# Patient Record
Sex: Male | Born: 1980 | Race: Black or African American | Hispanic: No | Marital: Single | Smoking: Never smoker
Health system: Southern US, Community
[De-identification: ages and names within clinical notes are randomized; demographics above are authoritative.]

---

## 2008-01-29 ENCOUNTER — Observation Stay: Payer: Self-pay | Admitting: Internal Medicine

## 2009-04-04 ENCOUNTER — Emergency Department: Payer: Self-pay | Admitting: Emergency Medicine

## 2009-10-11 ENCOUNTER — Emergency Department: Payer: Self-pay | Admitting: Emergency Medicine

## 2012-03-21 ENCOUNTER — Emergency Department: Payer: Self-pay | Admitting: Internal Medicine

## 2012-03-21 LAB — BASIC METABOLIC PANEL
Calcium, Total: 9 mg/dL (ref 8.5–10.1)
Co2: 25 mmol/L (ref 21–32)
Creatinine: 1.15 mg/dL (ref 0.60–1.30)
Glucose: 100 mg/dL — ABNORMAL HIGH (ref 65–99)
Osmolality: 278 (ref 275–301)

## 2012-03-21 LAB — CBC WITH DIFFERENTIAL/PLATELET
Basophil #: 0 10*3/uL (ref 0.0–0.1)
Basophil %: 0.4 %
HCT: 44.7 % (ref 40.0–52.0)
HGB: 14.6 g/dL (ref 13.0–18.0)
Lymphocyte %: 10.3 %
MCHC: 32.6 g/dL (ref 32.0–36.0)
MCV: 81 fL (ref 80–100)
Monocyte #: 0.4 x10 3/mm (ref 0.2–1.0)
Neutrophil #: 9.8 10*3/uL — ABNORMAL HIGH (ref 1.4–6.5)
Platelet: 289 10*3/uL (ref 150–440)
RBC: 5.49 10*6/uL (ref 4.40–5.90)
RDW: 14.9 % — ABNORMAL HIGH (ref 11.5–14.5)
WBC: 11.4 10*3/uL — ABNORMAL HIGH (ref 3.8–10.6)

## 2012-03-21 LAB — RAPID INFLUENZA A&B ANTIGENS

## 2012-05-02 ENCOUNTER — Emergency Department: Payer: Self-pay | Admitting: Emergency Medicine

## 2012-08-30 ENCOUNTER — Emergency Department: Payer: Self-pay | Admitting: Emergency Medicine

## 2013-06-05 ENCOUNTER — Emergency Department: Payer: Self-pay | Admitting: Emergency Medicine

## 2013-09-25 ENCOUNTER — Emergency Department: Payer: Self-pay | Admitting: Emergency Medicine

## 2013-09-25 LAB — CBC WITH DIFFERENTIAL/PLATELET
BASOS PCT: 1.3 %
Basophil #: 0.1 10*3/uL (ref 0.0–0.1)
EOS ABS: 0.1 10*3/uL (ref 0.0–0.7)
EOS PCT: 1.7 %
HCT: 42.5 % (ref 40.0–52.0)
HGB: 13.8 g/dL (ref 13.0–18.0)
LYMPHS ABS: 1.9 10*3/uL (ref 1.0–3.6)
Lymphocyte %: 30.9 %
MCH: 27 pg (ref 26.0–34.0)
MCHC: 32.5 g/dL (ref 32.0–36.0)
MCV: 83 fL (ref 80–100)
Monocyte #: 0.5 x10 3/mm (ref 0.2–1.0)
Monocyte %: 8.9 %
Neutrophil #: 3.5 10*3/uL (ref 1.4–6.5)
Neutrophil %: 57.2 %
Platelet: 326 10*3/uL (ref 150–440)
RBC: 5.12 10*6/uL (ref 4.40–5.90)
RDW: 15 % — ABNORMAL HIGH (ref 11.5–14.5)
WBC: 6.1 10*3/uL (ref 3.8–10.6)

## 2013-09-25 LAB — URINALYSIS, COMPLETE
BLOOD: NEGATIVE
Bacteria: NONE SEEN
Bilirubin,UR: NEGATIVE
Glucose,UR: NEGATIVE mg/dL (ref 0–75)
Ketone: NEGATIVE
Leukocyte Esterase: NEGATIVE
Nitrite: NEGATIVE
PH: 6 (ref 4.5–8.0)
PROTEIN: NEGATIVE
RBC,UR: 3 /HPF (ref 0–5)
SPECIFIC GRAVITY: 1.02 (ref 1.003–1.030)
Squamous Epithelial: <1

## 2013-09-25 LAB — COMPREHENSIVE METABOLIC PANEL
ALBUMIN: 3.6 g/dL (ref 3.4–5.0)
ALK PHOS: 66 U/L
ALT: 23 U/L
ANION GAP: 9 (ref 7–16)
AST: 27 U/L (ref 15–37)
BUN: 15 mg/dL (ref 7–18)
Bilirubin,Total: 0.2 mg/dL (ref 0.2–1.0)
CO2: 24 mmol/L (ref 21–32)
Calcium, Total: 8.7 mg/dL (ref 8.5–10.1)
Chloride: 107 mmol/L (ref 98–107)
Creatinine: 1 mg/dL (ref 0.60–1.30)
EGFR (Non-African Amer.): >60
Glucose: 83 mg/dL (ref 65–99)
Osmolality: 279 (ref 275–301)
Potassium: 4.2 mmol/L (ref 3.5–5.1)
SODIUM: 140 mmol/L (ref 136–145)
Total Protein: 7.9 g/dL (ref 6.4–8.2)

## 2013-09-25 LAB — LIPASE, BLOOD: Lipase: 145 U/L (ref 73–393)

## 2013-10-19 ENCOUNTER — Ambulatory Visit: Payer: Self-pay | Admitting: Unknown Physician Specialty

## 2014-02-01 ENCOUNTER — Emergency Department: Payer: Self-pay | Admitting: Emergency Medicine

## 2014-08-08 ENCOUNTER — Emergency Department
Admission: EM | Admit: 2014-08-08 | Discharge: 2014-08-08 | Disposition: A | Discharge status: Home / Self Care | Payer: Self-pay | Attending: Emergency Medicine | Admitting: Emergency Medicine

## 2014-08-08 ENCOUNTER — Emergency Department: Payer: Self-pay

## 2014-08-08 DIAGNOSIS — Y9289 Other specified places as the place of occurrence of the external cause: Secondary | ICD-10-CM | POA: Insufficient documentation

## 2014-08-08 DIAGNOSIS — S60222A Contusion of left hand, initial encounter: Secondary | ICD-10-CM | POA: Insufficient documentation

## 2014-08-08 DIAGNOSIS — W228XXA Striking against or struck by other objects, initial encounter: Secondary | ICD-10-CM | POA: Insufficient documentation

## 2014-08-08 DIAGNOSIS — Y9389 Activity, other specified: Secondary | ICD-10-CM | POA: Insufficient documentation

## 2014-08-08 DIAGNOSIS — Y998 Other external cause status: Secondary | ICD-10-CM | POA: Insufficient documentation

## 2014-08-08 MED ORDER — IBUPROFEN 800 MG PO TABS: 800.0000 mg | ORAL_TABLET | Freq: Three times a day (TID) | ORAL | Status: DC

## 2014-08-08 NOTE — ED Notes (Signed)
Patient discharged with instructions to follow up with orthopedist (Dr. Ernest Pine) if pain is not improving in a few days.  Patient also given instructions on using ice pack.  Patient verbalized understanding.

## 2014-08-08 NOTE — ED Notes (Signed)
Patient presents to the ED with left hand pain to the radial side of patient's hand.  Patient states he hit a door with his open hand as he was attempting to place it in a door frame.  Patient reports severe throbbing pain to hand.  Sensation and mobility intact in hand.

## 2014-08-08 NOTE — ED Provider Notes (Signed)
[facility] Emergency Department Provider Note  ____________________________________________  Time seen:  8:08 AM  I have reviewed the triage vital signs and the nursing notes.   HISTORY  Chief Complaint Hand Pain   HPI Marcus Keller is a 34 y.o. male is here today with complaint of left hand pain. He states that he was hitting a door with his hand attemptingto make a fist into a door frame. He took ibuprofen last night without any relief. He is here to have this checked out as well as he has brought his son who also had an injury to his hand. Range of motion is restricted due to pain that he rates a 10 out of 10. He is unable to make a fist due to the pain. Movement makes this worse, ibuprofen has not helped. He denies any previous fractures to his hand.  No past medical history on file.  There are no active problems to display for this patient.   No past surgical history on file.  Current Outpatient Rx  Name  Route  Sig  Dispense  Refill  . ibuprofen (ADVIL,MOTRIN) 800 MG tablet   Oral   Take 1 tablet (800 mg total) by mouth 3 (three) times daily.   30 tablet   0     Allergies Flexeril and Tramadol  No family history on file.  Social History History  Substance Use Topics  . Smoking status: Not on file  . Smokeless tobacco: Not on file  . Alcohol Use: Not on file    Review of Systems Constitutional: No fever/chills Eyes: No visual changes. ENT: No sore throat. Cardiovascular: Denies chest pain. Respiratory: Denies shortness of breath. Gastrointestinal: No abdominal pain.  No nausea, no vomiting.  Musculoskeletal: Negative for back pain. Skin: Negative for rash. Neurological: Negative for headaches, focal weakness or numbness.  10-point ROS otherwise negative.  ____________________________________________   PHYSICAL EXAM:  VITAL SIGNS: ED Triage Vitals  Enc Vitals Group     BP --      Pulse --      Resp --      Temp  --      Temp src --      SpO2 --      Weight --      Height --      Head Cir --      Peak Flow --      Pain Score --      Pain Loc --      Pain Edu? --      Excl. in GC? --     Constitutional: Alert and oriented. Well appearing and in no acute distress. Eyes: Conjunctivae are normal. PERRL. EOMI. Head: Atraumatic. Nose: No congestion/rhinnorhea. Mouth/Throat: Mucous membranes are moist.  Oropharynx non-erythematous. Neck: No stridor.   Cardiovascular: Normal rate, regular rhythm. Grossly normal heart sounds.  Good peripheral circulation. Respiratory: Normal respiratory effort.  No retractions. Lungs CTAB. Gastrointestinal: Soft and nontender. No distention. No abdominal bruits. No CVA tenderness. Musculoskeletal: Moderate tenderness on palpation of the left hyperthenar eminence area. Range of motion of the thumb is restricted secondary to pain and grip is weak for the same reason. There is no ecchymosis or abrasion. Pulses equal bilaterally motor sensory function intact No lower extremity tenderness nor edema.  No joint effusions. Neurologic:  Normal speech and language. No gross focal neurologic deficits are appreciated. No gait instability. Skin:  Skin is warm, dry and intact. No rash noted. Psychiatric: Mood and  affect are normal. Speech and behavior are normal.  ____________________________________________   LABS (all labs ordered are listed, but only abnormal results are displayed)  Labs Reviewed - No data to display   RADIOLOGY  X-ray left hand does show any fracture or dislocation per radiologist. Beaulah Corin, personally viewed and evaluated these images as part of my medical decision making.  ____________________________________________   PROCEDURES  Procedure(s) performed: Jones wrap was placed to the left hand by myself.  Critical Care performed: No  ____________________________________________   INITIAL IMPRESSION / ASSESSMENT AND PLAN / ED  COURSE  Pertinent labs & imaging results that were available during my care of the patient were reviewed by me and considered in my medical decision making (see chart for details).  Patient is to ice and elevate hand. He is continue on ibuprofen and S2 for pain and inflammation. He is follow-up with Dr. Ernest Pine if any continued problems with his hand. ____________________________________________   FINAL CLINICAL IMPRESSION(S) / ED DIAGNOSES  Final diagnoses:  Contusion, hand, left, initial encounter      Tommi Rumps, PA-C 08/08/14 1130  Minna Antis, MD 08/09/14 1259

## 2014-08-08 NOTE — Discharge Instructions (Signed)
Contusion °A contusion is a deep bruise. Contusions happen when an injury causes bleeding under the skin. Signs of bruising include pain, puffiness (swelling), and discolored skin. The contusion may turn blue, purple, or yellow. °HOME CARE  °· Put ice on the injured area. °· Put ice in a plastic bag. °· Place a towel between your skin and the bag. °· Leave the ice on for 15-20 minutes, 03-04 times a day. °· Only take medicine as told by your doctor. °· Rest the injured area. °· If possible, raise (elevate) the injured area to lessen puffiness. °GET HELP RIGHT AWAY IF:  °· You have more bruising or puffiness. °· You have pain that is getting worse. °· Your puffiness or pain is not helped by medicine. °MAKE SURE YOU:  °· Understand these instructions. °· Will watch your condition. °· Will get help right away if you are not doing well or get worse. °Document Released: 06/17/2007 Document Revised: 03/23/2011 Document Reviewed: 11/03/2010 °ExitCare® Patient Information ©2015 [employer]. This information is not intended to replace advice given to you by your health care provider. Make sure you discuss any questions you have with your health care provider. ° °Cryotherapy °Cryotherapy is when you put ice on your injury. Ice helps lessen pain and puffiness (swelling) after an injury. Ice works the best when you start using it in the first 24 to 48 hours after an injury. °HOME CARE °· Put a dry or damp towel between the ice pack and your skin. °· You may press gently on the ice pack. °· Leave the ice on for no more than 10 to 20 minutes at a time. °· Check your skin after 5 minutes to make sure your skin is okay. °· Rest at least 20 minutes between ice pack uses. °· Stop using ice when your skin loses feeling (numbness). °· Do not use ice on someone who cannot tell you when it hurts. This includes small children and people with memory problems (dementia). °GET HELP RIGHT AWAY IF: °· You have white spots on your  skin. °· Your skin turns blue or pale. °· Your skin feels waxy or hard. °· Your puffiness gets worse. °MAKE SURE YOU:  °· Understand these instructions. °· Will watch your condition. °· Will get help right away if you are not doing well or get worse. °Document Released: 06/17/2007 Document Revised: 03/23/2011 Document Reviewed: 08/21/2010 °ExitCare® Patient Information ©2015 [employer]. This information is not intended to replace advice given to you by your health care provider. Make sure you discuss any questions you have with your health care provider. ° °

## 2015-01-23 ENCOUNTER — Emergency Department
Admission: EM | Admit: 2015-01-23 | Discharge: 2015-01-23 | Disposition: A | Discharge status: Home / Self Care | Payer: Self-pay | Attending: Emergency Medicine | Admitting: Emergency Medicine

## 2015-01-23 DIAGNOSIS — Z791 Long term (current) use of non-steroidal anti-inflammatories (NSAID): Secondary | ICD-10-CM | POA: Insufficient documentation

## 2015-01-23 DIAGNOSIS — J020 Streptococcal pharyngitis: Secondary | ICD-10-CM | POA: Insufficient documentation

## 2015-01-23 MED ORDER — PSEUDOEPH-BROMPHEN-DM 30-2-10 MG/5ML PO SYRP
5.0000 mL | ORAL_SOLUTION | Freq: Four times a day (QID) | ORAL | Status: DC | PRN
Start: 2015-01-23 — End: 2015-07-05

## 2015-01-23 MED ORDER — AMOXICILLIN 500 MG PO CAPS: 500.0000 mg | ORAL_CAPSULE | Freq: Three times a day (TID) | ORAL | Status: DC

## 2015-01-23 MED ORDER — LIDOCAINE VISCOUS 2 % MT SOLN: 5.0000 mL | Freq: Four times a day (QID) | OROMUCOSAL | Status: DC | PRN

## 2015-01-23 NOTE — ED Provider Notes (Signed)
[facility] Emergency Department Provider Note  ____________________________________________  Time seen: Approximately 1:21 PM  I have reviewed the triage vital signs and the nursing notes.   HISTORY  Chief Complaint Sore Throat    HPI Marcus Keller is a 36 y.o. male  patient complaining of sore throat for 2 days. Patient also state nasal congestion. He denies any coughing at this time. Patient denies any fever. Patient states swollen glands bilateral neck. No palliative measures taken for this complaint.  History reviewed. No pertinent past medical history.  There are no active problems to display for this patient.   History reviewed. No pertinent past surgical history.  Current Outpatient Rx  Name  Route  Sig  Dispense  Refill  . amoxicillin (AMOXIL) 500 MG capsule   Oral   Take 1 capsule (500 mg total) by mouth 3 (three) times daily.   30 capsule   0   . brompheniramine-pseudoephedrine-DM 30-2-10 MG/5ML syrup   Oral   Take 5 mLs by mouth 4 (four) times daily as needed. Mixed with 5 ML's of viscous lidocaine for swish and swallow.   120 mL   0   . ibuprofen (ADVIL,MOTRIN) 800 MG tablet   Oral   Take 1 tablet (800 mg total) by mouth 3 (three) times daily.   30 tablet   0   . lidocaine (XYLOCAINE) 2 % solution   Mouth/Throat   Use as directed 5 mLs in the mouth or throat every 6 (six) hours as needed for mouth pain. Mixed with 5 mL of Bromfed-DM for swish and swallow.   100 mL   0     Allergies Flexeril and Tramadol  No family history on file.  Social History Social History  Substance Use Topics  . Smoking status: Never Smoker   . Smokeless tobacco: None  . Alcohol Use: None    Review of Systems Constitutional: No fever/chills Eyes: No visual changes. ENT: Sore throat Cardiovascular: Denies chest pain. Respiratory: Denies shortness of breath. Gastrointestinal: No abdominal pain.  No nausea, no vomiting.  No diarrhea.   No constipation. Genitourinary: Negative for dysuria. Musculoskeletal: Negative for back pain. Skin: Negative for rash. Neurological: Negative for headaches, focal weakness or numbness. Allergic/Immunilogical: Tramadol and Flexeril  10-point ROS otherwise negative.  ____________________________________________   PHYSICAL EXAM:  VITAL SIGNS: ED Triage Vitals  Enc Vitals Group     BP --      Pulse --      Resp --      Temp --      Temp src --      SpO2 --      Weight --      Height --      Head Cir --      Peak Flow --      Pain Score --      Pain Loc --      Pain Edu? --      Excl. in GC? --     Constitutional: Alert and oriented. Well appearing and in no acute distress. Eyes: Conjunctivae are normal. PERRL. EOMI. Head: Atraumatic. Nose: No congestion/rhinnorhea. Mouth/Throat: Mucous membranes are moist.  Oropharynx non-erythematous. Neck: No stridor.  No cervical spine tenderness to palpation. Hematological/Lymphatic/Immunilogical: Bilateral cervical lymphadenopathy. Cardiovascular: Normal rate, regular rhythm. Grossly normal heart sounds.  Good peripheral circulation. Respiratory: Normal respiratory effort.  No retractions. Lungs CTAB. Gastrointestinal: Soft and nontender. No distention. No abdominal bruits. No CVA tenderness. Musculoskeletal: No lower extremity tenderness  nor edema.  No joint effusions. Neurologic:  Normal speech and language. No gross focal neurologic deficits are appreciated. No gait instability. Skin:  Skin is warm, dry and intact. No rash noted. Psychiatric: Mood and affect are normal. Speech and behavior are normal.  ____________________________________________   LABS (all labs ordered are listed, but only abnormal results are displayed)  Labs Reviewed - No data to  display ____________________________________________  EKG   ____________________________________________  RADIOLOGY   ____________________________________________   PROCEDURES  Procedure(s) performed: None  Critical Care performed: No  ____________________________________________   INITIAL IMPRESSION / ASSESSMENT AND PLAN / ED COURSE  Pertinent labs & imaging results that were available during my care of the patient were reviewed by me and considered in my medical decision making (see chart for details).  A strep test. Patient given prescription for amoxicillin, Bromfed DM, and viscous lidocaine. She given discharge Instructions and advised to follow-up with family doctor if condition persists. ____________________________________________   FINAL CLINICAL IMPRESSION(S) / ED DIAGNOSES  Final diagnoses:  Pharyngitis, streptococcal      Joni ReiningRonald K Smith, PA-C 01/23/15 1410  Myrna Blazeravid Matthew Schaevitz, MD 01/23/15 [phone]

## 2015-01-23 NOTE — ED Notes (Signed)
Pt reports to ED w/ sore throat for last 2 days

## 2015-01-23 NOTE — Discharge Instructions (Signed)

## 2015-01-25 LAB — POCT RAPID STREP A: STREPTOCOCCUS, GROUP A SCREEN (DIRECT): POSITIVE — AB

## 2015-07-05 ENCOUNTER — Encounter: Payer: Self-pay | Admitting: *Deleted

## 2015-07-05 ENCOUNTER — Emergency Department
Admission: EM | Admit: 2015-07-05 | Discharge: 2015-07-05 | Disposition: A | Discharge status: Home / Self Care | Payer: 59 | Attending: Emergency Medicine | Admitting: Emergency Medicine

## 2015-07-05 DIAGNOSIS — M791 Myalgia, unspecified site: Secondary | ICD-10-CM

## 2015-07-05 DIAGNOSIS — R52 Pain, unspecified: Secondary | ICD-10-CM | POA: Diagnosis present

## 2015-07-05 LAB — BASIC METABOLIC PANEL
ANION GAP: 8 (ref 5–15)
BUN: 14 mg/dL (ref 6–20)
CALCIUM: 8.8 mg/dL — AB (ref 8.9–10.3)
CO2: 23 mmol/L (ref 22–32)
Chloride: 105 mmol/L (ref 101–111)
Creatinine, Ser: 1.01 mg/dL (ref 0.61–1.24)
Glucose, Bld: 93 mg/dL (ref 65–99)
POTASSIUM: 3.8 mmol/L (ref 3.5–5.1)
Sodium: 136 mmol/L (ref 135–145)

## 2015-07-05 LAB — CBC WITH DIFFERENTIAL/PLATELET
BASOS ABS: 0 10*3/uL (ref 0–0.1)
BASOS PCT: 1 %
Eosinophils Absolute: 0 10*3/uL (ref 0–0.7)
Eosinophils Relative: 1 %
HEMATOCRIT: 42.9 % (ref 40.0–52.0)
HEMOGLOBIN: 14 g/dL (ref 13.0–18.0)
LYMPHS PCT: 22 %
Lymphs Abs: 1.1 10*3/uL (ref 1.0–3.6)
MCH: 26.6 pg (ref 26.0–34.0)
MCHC: 32.6 g/dL (ref 32.0–36.0)
MCV: 81.6 fL (ref 80.0–100.0)
Monocytes Absolute: 0.6 10*3/uL (ref 0.2–1.0)
Monocytes Relative: 13 %
NEUTROS ABS: 3.1 10*3/uL (ref 1.4–6.5)
NEUTROS PCT: 63 %
Platelets: 254 10*3/uL (ref 150–440)
RBC: 5.26 MIL/uL (ref 4.40–5.90)
RDW: 14.7 % — ABNORMAL HIGH (ref 11.5–14.5)
WBC: 4.9 10*3/uL (ref 3.8–10.6)

## 2015-07-05 MED ORDER — IBUPROFEN 800 MG PO TABS: 800.0000 mg | ORAL_TABLET | Freq: Three times a day (TID) | ORAL | Status: DC | PRN

## 2015-07-05 MED ORDER — BACLOFEN 10 MG PO TABS: 10.0000 mg | ORAL_TABLET | Freq: Three times a day (TID) | ORAL | Status: DC

## 2015-07-05 MED ORDER — IBUPROFEN 800 MG PO TABS: 800.0000 mg | ORAL_TABLET | Freq: Three times a day (TID) | ORAL | Status: AC | PRN

## 2015-07-05 MED ORDER — AZITHROMYCIN 250 MG PO TABS: ORAL_TABLET | ORAL | Status: DC

## 2015-07-05 MED ORDER — ACETAMINOPHEN 500 MG PO TABS
1000.0000 mg | ORAL_TABLET | Freq: Once | ORAL | Status: AC
  Administered 2015-07-05: 1000 mg via ORAL
  Filled 2015-07-05: qty 2

## 2015-07-05 MED ORDER — KETOROLAC TROMETHAMINE 60 MG/2ML IM SOLN
60.0000 mg | Freq: Once | INTRAMUSCULAR | Status: AC
  Administered 2015-07-05: 60 mg via INTRAMUSCULAR
  Filled 2015-07-05: qty 2

## 2015-07-05 NOTE — Discharge Instructions (Signed)
Upper Respiratory Infection, Adult °Most upper respiratory infections (URIs) are a viral infection of the air passages leading to the lungs. A URI affects the nose, throat, and upper air passages. The most common type of URI is nasopharyngitis and is typically referred to as "the common cold." °URIs run their course and usually go away on their own. Most of the time, a URI does not require medical attention, but sometimes a bacterial infection in the upper airways can follow a viral infection. This is called a secondary infection. Sinus and middle ear infections are common types of secondary upper respiratory infections. °Bacterial pneumonia can also complicate a URI. A URI can worsen asthma and chronic obstructive pulmonary disease (COPD). Sometimes, these complications can require emergency medical care and may be life threatening.  °CAUSES °Almost all URIs are caused by viruses. A virus is a type of germ and can spread from one person to another.  °RISKS FACTORS °You may be at risk for a URI if:  °· You smoke.   °· You have chronic heart or lung disease. °· You have a weakened defense (immune) system.   °· You are very young or very old.   °· You have nasal allergies or asthma. °· You work in crowded or poorly ventilated areas. °· You work in health care facilities or schools. °SIGNS AND SYMPTOMS  °Symptoms typically develop 2-3 days after you come in contact with a cold virus. Most viral URIs last 7-10 days. However, viral URIs from the influenza virus (flu virus) can last 14-18 days and are typically more severe. Symptoms may include:  °· Runny or stuffy (congested) nose.   °· Sneezing.   °· Cough.   °· Sore throat.   °· Headache.   °· Fatigue.   °· Fever.   °· Loss of appetite.   °· Pain in your forehead, behind your eyes, and over your cheekbones (sinus pain). °· Muscle aches.   °DIAGNOSIS  °Your health care provider may diagnose a URI by: °· Physical exam. °· Tests to check that your symptoms are not due to  another condition such as: °· Strep throat. °· Sinusitis. °· Pneumonia. °· Asthma. °TREATMENT  °A URI goes away on its own with time. It cannot be cured with medicines, but medicines may be prescribed or recommended to relieve symptoms. Medicines may help: °· Reduce your fever. °· Reduce your cough. °· Relieve nasal congestion. °HOME CARE INSTRUCTIONS  °· Take medicines only as directed by your health care provider.   °· Gargle warm saltwater or take cough drops to comfort your throat as directed by your health care provider. °· Use a warm mist humidifier or inhale steam from a shower to increase air moisture. This may make it easier to breathe. °· Drink enough fluid to keep your urine clear or pale yellow.   °· Eat soups and other clear broths and maintain good nutrition.   °· Rest as needed.   °· Return to work when your temperature has returned to normal or as your health care provider advises. You may need to stay home longer to avoid infecting others. You can also use a face mask and careful hand washing to prevent spread of the virus. °· Increase the usage of your inhaler if you have asthma.   °· Do not use any tobacco products, including cigarettes, chewing tobacco, or electronic cigarettes. If you need help quitting, ask your health care provider. °PREVENTION  °The best way to protect yourself from getting a cold is to practice good hygiene.  °· Avoid oral or hand contact with people with cold   symptoms.   °· Wash your hands often if contact occurs.   °There is no clear evidence that vitamin C, vitamin E, echinacea, or exercise reduces the chance of developing a cold. However, it is always recommended to get plenty of rest, exercise, and practice good nutrition.  °SEEK MEDICAL CARE IF:  °· You are getting worse rather than better.   °· Your symptoms are not controlled by medicine.   °· You have chills. °· You have worsening shortness of breath. °· You have brown or red mucus. °· You have yellow or brown nasal  discharge. °· You have pain in your face, especially when you bend forward. °· You have a fever. °· You have swollen neck glands. °· You have pain while swallowing. °· You have white areas in the back of your throat. °SEEK IMMEDIATE MEDICAL CARE IF:  °· You have severe or persistent: °¨ Headache. °¨ Ear pain. °¨ Sinus pain. °¨ Chest pain. °· You have chronic lung disease and any of the following: °¨ Wheezing. °¨ Prolonged cough. °¨ Coughing up blood. °¨ A change in your usual mucus. °· You have a stiff neck. °· You have changes in your: °¨ Vision. °¨ Hearing. °¨ Thinking. °¨ Mood. °MAKE SURE YOU:  °· Understand these instructions. °· Will watch your condition. °· Will get help right away if you are not doing well or get worse. °  °This information is not intended to replace advice given to you by your health care provider. Make sure you discuss any questions you have with your health care provider. °  °Document Released: 06/24/2000 Document Revised: 05/15/2014 Document Reviewed: 04/05/2013 °Elsevier Interactive Patient Education ©2016 [employer] ° °Otitis Media, Adult °Otitis media is redness, soreness, and inflammation of the middle ear. Otitis media may be caused by allergies or, most commonly, by infection. Often it occurs as a complication of the common cold. °SIGNS AND SYMPTOMS °Symptoms of otitis media may include: °· Earache. °· Fever. °· Ringing in your ear. °· Headache. °· Leakage of fluid from the ear. °DIAGNOSIS °To diagnose otitis media, your health care provider will examine your ear with an otoscope. This is an instrument that allows your health care provider to see into your ear in order to examine your eardrum. Your health care provider also will ask you questions about your symptoms. °TREATMENT  °Typically, otitis media resolves on its own within 3-5 days. Your health care provider may prescribe medicine to ease your symptoms of pain. If otitis media does not resolve within 5 days or is  recurrent, your health care provider may prescribe antibiotic medicines if he or she suspects that a bacterial infection is the cause. °HOME CARE INSTRUCTIONS  °· If you were prescribed an antibiotic medicine, finish it all even if you start to feel better. °· Take medicines only as directed by your health care provider. °· Keep all follow-up visits as directed by your health care provider. °SEEK MEDICAL CARE IF: °· You have otitis media only in one ear, or bleeding from your nose, or both. °· You notice a lump on your neck. °· You are not getting better in 3-5 days. °· You feel worse instead of better. °SEEK IMMEDIATE MEDICAL CARE IF:  °· You have pain that is not controlled with medicine. °· You have swelling, redness, or pain around your ear or stiffness in your neck. °· You notice that part of your face is paralyzed. °· You notice that the bone behind your ear (mastoid) is tender when you touch   it. °MAKE SURE YOU:  °· Understand these instructions. °· Will watch your condition. °· Will get help right away if you are not doing well or get worse. °  °This information is not intended to replace advice given to you by your health care provider. Make sure you discuss any questions you have with your health care provider. °  °Document Released: 10/04/2003 Document Revised: 01/19/2014 Document Reviewed: 07/26/2012 °Elsevier Interactive Patient Education ©2016 Elsevier Inc. ° °

## 2015-07-05 NOTE — ED Notes (Signed)
Pt woke up with body aches, pt denies fever and any other symptoms

## 2015-07-05 NOTE — ED Provider Notes (Signed)
[facility] Emergency Department Provider Note  ____________________________________________  Time seen: Approximately 11:15 AM  I have reviewed the triage vital signs and the nursing notes.   HISTORY  Chief Complaint Generalized Body Aches    HPI Lenell AntuJoseph Gattis Tacey is a 35 y.o. male presents for complaints of some onset of body aches denies any fever chills or any other symptoms. No cough no runny nose no drainage. She states his body hurts. States that he woke up this morning feeling that way unable to go to work.   History reviewed. No pertinent past medical history.  There are no active problems to display for this patient.   History reviewed. No pertinent past surgical history.  Current Outpatient Rx  Name  Route  Sig  Dispense  Refill  . baclofen (LIORESAL) 10 MG tablet   Oral   Take 1 tablet (10 mg total) by mouth 3 (three) times daily.   30 tablet   0   . ibuprofen (ADVIL,MOTRIN) 800 MG tablet   Oral   Take 1 tablet (800 mg total) by mouth every 8 (eight) hours as needed.   30 tablet   0     Allergies Flexeril and Tramadol  No family history on file.  Social History Social History  Substance Use Topics  . Smoking status: Never Smoker   . Smokeless tobacco: None  . Alcohol Use: No    Review of Systems Constitutional: No fever/chills Eyes: No visual changes. ENT: No sore throat. Cardiovascular: Denies chest pain. Respiratory: Denies shortness of breath. Musculoskeletal: Positive for generalized body aches. Skin: Negative for rash. Neurological: Negative for headaches, focal weakness or numbness.  10-point ROS otherwise negative.  ____________________________________________   PHYSICAL EXAM:  VITAL SIGNS: ED Triage Vitals  Enc Vitals Group     BP 07/05/15 1032 129/92 mmHg     Pulse Rate 07/05/15 1032 66     Resp 07/05/15 1032 20     Temp 07/05/15 1032 98.3 F (36.8 C)     Temp Source 07/05/15 1032 Oral   SpO2 07/05/15 1032 98 %     Weight 07/05/15 1032 260 lb (117.935 kg)     Height 07/05/15 1032 6\' 2"  (1.88 m)     Head Cir --      Peak Flow --      Pain Score 07/05/15 1032 2     Pain Loc --      Pain Edu? --      Excl. in GC? --     Constitutional: Alert and oriented. Well appearing and in no acute distress. Head: Atraumatic. Nose: No congestion/rhinnorhea. Mouth/Throat: Mucous membranes are moist.  Oropharynx non-erythematous. Neck: No stridor.   Cardiovascular: Normal rate, regular rhythm. Grossly normal heart sounds.  Good peripheral circulation. Respiratory: Normal respiratory effort.  No retractions. Lungs CTAB. Musculoskeletal: No lower extremity tenderness nor edema.  No joint effusions. Neurologic:  Normal speech and language. No gross focal neurologic deficits are appreciated. No gait instability. Skin:  Skin is warm, dry and intact. No rash noted. Psychiatric: Mood and affect are normal. Speech and behavior are normal.  ____________________________________________   LABS (all labs ordered are listed, but only abnormal results are displayed)  Labs Reviewed  BASIC METABOLIC PANEL - Abnormal; Notable for the following:    Calcium 8.8 (*)    All other components within normal limits  CBC WITH DIFFERENTIAL/PLATELET - Abnormal; Notable for the following:    RDW 14.7 (*)    All other components  within normal limits   ____________________________________________   RADIOLOGY   ____________________________________________   PROCEDURES  Procedure(s) performed: None  Critical Care performed: No  ____________________________________________   INITIAL IMPRESSION / ASSESSMENT AND PLAN / ED COURSE  Pertinent labs & imaging results that were available during my care of the patient were reviewed by me and considered in my medical decision making (see chart for details).  Nonspecific myalgia. Rx given for baclofen and ibuprofen 800 mg. Patient given a work excuse  24 hours and follow-up with his PCP. ____________________________________________   FINAL CLINICAL IMPRESSION(S) / ED DIAGNOSES  Final diagnoses:  Myalgia     This chart was dictated using voice recognition software/Dragon. Despite best efforts to proofread, errors can occur which can change the meaning. Any change was purely unintentional.   Evangeline Dakinharles M Beers, PA-C 07/05/15 1232  Governor Rooksebecca Lord, MD 07/05/15 1505

## 2015-07-05 NOTE — ED Notes (Signed)
See triage note body aches with some fever today  Afebrile on arrival

## 2016-06-11 ENCOUNTER — Encounter: Payer: Self-pay | Admitting: Emergency Medicine

## 2016-06-11 ENCOUNTER — Emergency Department
Admission: EM | Admit: 2016-06-11 | Discharge: 2016-06-11 | Disposition: A | Discharge status: Home / Self Care | Payer: 59 | Attending: Emergency Medicine | Admitting: Emergency Medicine

## 2016-06-11 DIAGNOSIS — Z79899 Other long term (current) drug therapy: Secondary | ICD-10-CM | POA: Insufficient documentation

## 2016-06-11 DIAGNOSIS — Z791 Long term (current) use of non-steroidal anti-inflammatories (NSAID): Secondary | ICD-10-CM | POA: Insufficient documentation

## 2016-06-11 DIAGNOSIS — K2900 Acute gastritis without bleeding: Secondary | ICD-10-CM

## 2016-06-11 LAB — COMPREHENSIVE METABOLIC PANEL
ALBUMIN: 4 g/dL (ref 3.5–5.0)
ALT: 16 U/L — AB (ref 17–63)
AST: 21 U/L (ref 15–41)
Alkaline Phosphatase: 50 U/L (ref 38–126)
Anion gap: 6 (ref 5–15)
BUN: 19 mg/dL (ref 6–20)
CHLORIDE: 103 mmol/L (ref 101–111)
CO2: 27 mmol/L (ref 22–32)
CREATININE: 1.19 mg/dL (ref 0.61–1.24)
Calcium: 9.2 mg/dL (ref 8.9–10.3)
GFR calc Af Amer: >60 mL/min (ref >60)
GLUCOSE: 129 mg/dL — AB (ref 65–99)
POTASSIUM: 4.4 mmol/L (ref 3.5–5.1)
Sodium: 136 mmol/L (ref 135–145)
Total Bilirubin: 0.5 mg/dL (ref 0.3–1.2)
Total Protein: 7.4 g/dL (ref 6.5–8.1)

## 2016-06-11 LAB — CBC
HEMATOCRIT: 46.9 % (ref 40.0–52.0)
Hemoglobin: 15.7 g/dL (ref 13.0–18.0)
MCH: 27.3 pg (ref 26.0–34.0)
MCHC: 33.4 g/dL (ref 32.0–36.0)
MCV: 81.8 fL (ref 80.0–100.0)
PLATELETS: 287 10*3/uL (ref 150–440)
RBC: 5.74 MIL/uL (ref 4.40–5.90)
RDW: 14.5 % (ref 11.5–14.5)
WBC: 7.8 10*3/uL (ref 3.8–10.6)

## 2016-06-11 LAB — TROPONIN I
Troponin I: 0.03 ng/mL (ref <0.03)
Troponin I: <0.03 ng/mL (ref <0.03)

## 2016-06-11 LAB — LIPASE, BLOOD: LIPASE: 23 U/L (ref 11–51)

## 2016-06-11 MED ORDER — PANTOPRAZOLE SODIUM 40 MG PO TBEC: 40.0000 mg | DELAYED_RELEASE_TABLET | Freq: Every day | ORAL | 1 refills | Status: AC

## 2016-06-11 MED ORDER — GI COCKTAIL ~~LOC~~
30.0000 mL | Freq: Once | ORAL | Status: AC
  Administered 2016-06-11: 30 mL via ORAL
  Filled 2016-06-11: qty 30

## 2016-06-11 MED ORDER — ONDANSETRON HCL 4 MG/2ML IJ SOLN
4.0000 mg | Freq: Once | INTRAMUSCULAR | Status: AC
  Administered 2016-06-11: 4 mg via INTRAVENOUS
  Filled 2016-06-11: qty 2

## 2016-06-11 NOTE — ED Notes (Addendum)
Date and time results received: 06/11/16 0840 (use smartphrase ".now" to insert current time)  Test: troponin Critical Value: 0.03  Name of Provider Notified: Dr. Cyril LoosenKinner

## 2016-06-11 NOTE — ED Triage Notes (Signed)
Pt presents with abd pain and nausea since Saturday.

## 2016-06-11 NOTE — ED Notes (Signed)
Duwaine MaxinAshley Smith RN in room upon arrival in ED.  Dr. Cyril LoosenKinner aware of patient.

## 2016-06-11 NOTE — ED Provider Notes (Signed)
[facility] Emergency Department Provider Note   ____________________________________________    I have reviewed the triage vital signs and the nursing notes.   HISTORY  Chief Complaint Nausea and Abdominal Pain     HPI Marcus Keller is a 36 y.o. male who presents with abdominal pain. Patient reports that approximately 3:30 this morning he developed gradually worsening abdominal pain around the umbilicus and epigastrium, he describes it as constant moderate to severe and burning. He reports nausea but no vomiting. He reports a long history of severe heartburn. He denies alcohol abuse. No drugs. He does not take medication for heartburn. He has not taken anything for this. No sick contacts reported. He denies chest pain, no shortness of breath. No diaphoresis.   History reviewed. No pertinent past medical history.  There are no active problems to display for this patient.   History reviewed. No pertinent surgical history.  Prior to Admission medications   Medication Sig Start Date End Date Taking? Authorizing Provider  baclofen (LIORESAL) 10 MG tablet Take 1 tablet (10 mg total) by mouth 3 (three) times daily. Patient not taking: Reported on 06/11/2016 07/05/15   Evangeline DakinBeers, Charles M, PA-C  ibuprofen (ADVIL,MOTRIN) 800 MG tablet Take 1 tablet (800 mg total) by mouth every 8 (eight) hours as needed. Patient not taking: Reported on 06/11/2016 07/05/15   Evangeline DakinBeers, Charles M, PA-C  pantoprazole (PROTONIX) 40 MG tablet Take 1 tablet (40 mg total) by mouth daily. 06/11/16 06/11/17  Jene EveryKinner, Aristidis Talerico, MD     Allergies Flexeril [cyclobenzaprine] and Tramadol  No family history on file.  Social History Social History  Substance Use Topics  . Smoking status: Never Smoker  . Smokeless tobacco: Never Used  . Alcohol use No    Review of Systems  Constitutional: No fever/chills Eyes: No visual changes.  ENT: No sore throat. Cardiovascular: Denies chest  pain. Respiratory: Denies shortness of breath. Gastrointestinal: As above Genitourinary: Negative for dysuria. Musculoskeletal: Negative for back pain. Skin: Negative for rash. Neurological: Negative for headaches    ____________________________________________   PHYSICAL EXAM:  VITAL SIGNS: ED Triage Vitals  Enc Vitals Group     BP 06/11/16 0734 (!) 157/97     Pulse Rate 06/11/16 0734 (!) 54     Resp 06/11/16 0734 20     Temp 06/11/16 0734 98.4 F (36.9 C)     Temp Source 06/11/16 0734 Oral     SpO2 06/11/16 0734 100 %     Weight 06/11/16 0734 123.4 kg (272 lb)     Height 06/11/16 0734 1.854 m (6\' 1" )     Head Circumference --      Peak Flow --      Pain Score 06/11/16 0738 9     Pain Loc --      Pain Edu? --      Excl. in GC? --     Constitutional: Alert and oriented. Pleasant and interactive Eyes: Conjunctivae are normal.   Nose: No congestion/rhinnorhea. Mouth/Throat: Mucous membranes are moist.    Cardiovascular: Normal rate, regular rhythm. Grossly normal heart sounds.  Good peripheral circulation. Respiratory: Normal respiratory effort.  No retractions. Lungs CTAB. Gastrointestinal: Tenderness to palpation epigastrium and left upper quadrant. No distention.  No CVA tenderness. Genitourinary: deferred Musculoskeletal: Warm and well perfused Neurologic:  Normal speech and language. No gross focal neurologic deficits are appreciated.  Skin:  Skin is warm, dry and intact. No rash noted. Psychiatric: Mood and affect are normal. Speech and  behavior are normal.  ____________________________________________   LABS (all labs ordered are listed, but only abnormal results are displayed)  Labs Reviewed  COMPREHENSIVE METABOLIC PANEL - Abnormal; Notable for the following:       Result Value   Glucose, Bld 129 (*)    ALT 16 (*)    All other components within normal limits  TROPONIN I - Abnormal; Notable for the following:    Troponin I 0.03 (*)    All other  components within normal limits  LIPASE, BLOOD  CBC  TROPONIN I  URINALYSIS, COMPLETE (UACMP) WITH MICROSCOPIC   ____________________________________________  EKG ED ECG REPORT I, Jene Every, the attending physician, personally viewed and interpreted this ECG.  Date: 06/11/2016 EKG Time: 0739 Rate:58 Rhythm:  sinus rhythm QRS Axis: normal Intervals: normal ST/T Wave abnormalities: Elevation in one two and aVF Conduction Disturbances: none  Patient has absolutely no chest pain. His pain is all in his abdomen. This does not appear consistent with ACS. We have no old EKG to compare this to  ____________________________________________  RADIOLOGY  None ____________________________________________   PROCEDURES  Procedure(s) performed: No    Critical Care performed: No ____________________________________________   INITIAL IMPRESSION / ASSESSMENT AND PLAN / ED COURSE  Pertinent labs & imaging results that were available during my care of the patient were reviewed by me and considered in my medical decision making (see chart for details).  Patient's history of present illness as well as consistent with gastritis versus PUD, he was treated with Zofran and GI cocktail and had nearly complete resolution of his pain and nausea. His troponin is 0.03, we will recheck this  ----------------------------------------- 12:04 PM on 06/11/2016 -----------------------------------------  Second troponin normal, patient remains quite comfortable and asymptomatic. I'll start him on PPIs. He knows to return if his pain returns or any change in his symptoms.    ____________________________________________   FINAL CLINICAL IMPRESSION(S) / ED DIAGNOSES  Final diagnoses:  Acute gastritis without hemorrhage, unspecified gastritis type      NEW MEDICATIONS STARTED DURING THIS VISIT:  New Prescriptions   PANTOPRAZOLE (PROTONIX) 40 MG TABLET    Take 1 tablet (40 mg total) by  mouth daily.     Note:  This document was prepared using Dragon voice recognition software and may include unintentional dictation errors.    Jene Every, MD 06/11/16 [phone]

## 2017-08-08 ENCOUNTER — Encounter: Payer: Self-pay | Admitting: Emergency Medicine

## 2017-08-08 ENCOUNTER — Emergency Department
Admission: EM | Admit: 2017-08-08 | Discharge: 2017-08-08 | Disposition: A | Discharge status: Home / Self Care | Payer: Self-pay | Attending: Emergency Medicine | Admitting: Emergency Medicine

## 2017-08-08 ENCOUNTER — Emergency Department: Payer: Self-pay

## 2017-08-08 DIAGNOSIS — M545 Low back pain, unspecified: Secondary | ICD-10-CM

## 2017-08-08 DIAGNOSIS — Z79899 Other long term (current) drug therapy: Secondary | ICD-10-CM | POA: Insufficient documentation

## 2017-08-08 MED ORDER — DIAZEPAM 2 MG PO TABS: 2.0000 mg | ORAL_TABLET | Freq: Three times a day (TID) | ORAL | 0 refills | Status: AC | PRN

## 2017-08-08 MED ORDER — KETOROLAC TROMETHAMINE 30 MG/ML IJ SOLN
30.0000 mg | Freq: Once | INTRAMUSCULAR | Status: AC
  Administered 2017-08-08: 30 mg via INTRAMUSCULAR
  Filled 2017-08-08: qty 1

## 2017-08-08 MED ORDER — NAPROXEN 500 MG PO TABS: 500.0000 mg | ORAL_TABLET | Freq: Two times a day (BID) | ORAL | 0 refills | Status: DC

## 2017-08-08 MED ORDER — DIAZEPAM 5 MG PO TABS
5.0000 mg | ORAL_TABLET | Freq: Once | ORAL | Status: AC
  Administered 2017-08-08: 5 mg via ORAL
  Filled 2017-08-08: qty 1

## 2017-08-08 NOTE — ED Triage Notes (Signed)
Patient presents to the ED with left lower back pain x 2 weeks.  Patient states area is very tender and sore to touch.  Patient states it is more comfortable to stand than to sit.  Patient states, "it feels like an elephant sitting on my butt."  Patient is in no obvious distress at this time.  Patient reports tingling going down legs bilaterally.  Patient denies known injury.

## 2017-08-08 NOTE — Discharge Instructions (Signed)
Please follow up with the primary care provider of your choice for symptoms that are not improving over the next few days. ° °Return to the ER for symptoms that change or worsen if unable to schedule an appointment. °

## 2017-08-08 NOTE — ED Provider Notes (Signed)
[facility] Emergency Department Provider Note ____________________________________________  Time seen: Approximately 10:38 AM  I have reviewed the triage vital signs and the nursing notes.  HISTORY  Chief Complaint Back Pain   HPI Marcus Keller is a 37 y.o. male who presents to the emergency department for treatment and evaluation of low back pain x 2 weeks. No specific injury. He works for a [employer], but does not recall a specific activity at onset of pain. No relief with ibuprofen although he has not taken it for the past 2 days. He denies previous back injury or pain. Pain radiates into both legs intermittently. Low back "pressure."  History reviewed. No pertinent past medical history.  There are no active problems to display for this patient.   History reviewed. No pertinent surgical history.  Prior to Admission medications   Medication Sig Start Date End Date Taking? Authorizing Provider  baclofen (LIORESAL) 10 MG tablet Take 1 tablet (10 mg total) by mouth 3 (three) times daily. Patient not taking: Reported on 06/11/2016 07/05/15   Evangeline DakinBeers, Charles M, PA-C  diazepam (VALIUM) 2 MG tablet Take 1 tablet (2 mg total) by mouth every 8 (eight) hours as needed. 08/08/17   Bryla Burek, Rulon Eisenmengerari B, FNP  ibuprofen (ADVIL,MOTRIN) 800 MG tablet Take 1 tablet (800 mg total) by mouth every 8 (eight) hours as needed. Patient not taking: Reported on 06/11/2016 07/05/15   Evangeline DakinBeers, Charles M, PA-C  naproxen (NAPROSYN) 500 MG tablet Take 1 tablet (500 mg total) by mouth 2 (two) times daily with a meal. 08/08/17   Silus Lanzo B, FNP  pantoprazole (PROTONIX) 40 MG tablet Take 1 tablet (40 mg total) by mouth daily. 06/11/16 06/11/17  Jene EveryKinner, Robert, MD    Allergies Flexeril [cyclobenzaprine] and Tramadol  No family history on file.  Social History Social History   Tobacco Use  . Smoking status: Never Smoker  . Smokeless tobacco: Never Used  Substance Use Topics  .  Alcohol use: No  . Drug use: Not on file    Review of Systems Constitutional: Well appearing. Respiratory: Negative for dyspnea. Cardiovascular: Negative for change in skin temperature or color. Musculoskeletal:   Negative for chronic steroid use   Negative for trauma in the presence of osteoporosis  Negative for age over 5650 and trauma.  Negative for constitutional symptoms, or history of cancer.  Negative for pain worse at night. Skin: Negative for rash, lesion, or wound.  Genitourinary: Negative for urinary retention or dysuria. Rectal: Negative for fecal incontinence or new onset constipation/bowel habit changes. Hematological/Immunilogical: Negative for immunosuppression, IV drug use, or fever Neurological: Positive for burning, tingling, numb, electric, radiating pain in the right and left posterior thighs intermittently.                        Negative for saddle anesthesia.                        Negative for focal neurologic deficit, progressive or disabling symptoms            ____________________________________________   PHYSICAL EXAM:  VITAL SIGNS: ED Triage Vitals  Enc Vitals Group     BP 08/08/17 1000 124/80     Pulse Rate 08/08/17 1000 83     Resp 08/08/17 1000 18     Temp 08/08/17 1000 98.3 F (36.8 C)     Temp Source 08/08/17 1000 Oral     SpO2 08/08/17  1000 97 %     Weight 08/08/17 1001 284 lb 9.6 oz (129.1 kg)     Height 08/08/17 1001 6\' 1"  (1.854 m)     Head Circumference --      Peak Flow --      Pain Score 08/08/17 1000 10     Pain Loc --      Pain Edu? --      Excl. in GC? --     Constitutional: Alert and oriented. Uncomfortable appearing and in no acute distress. Eyes: Conjunctivae are clear without discharge or drainage.  Head: Atraumatic. Neck: Full, active range of motion. Respiratory: Respirations even and unlabored. Musculoskeletal: AROM of the lower extremities, limited flexion at the waist due to pain, Strength 5/5 of the lower  extremities as tested. Neurologic: Reflexes of the lower extremities are 2+. Negative for straight leg raise on the right or left side. Skin: Atraumatic.  Psychiatric: Behavior and affect are normal.  ____________________________________________   LABS (all labs ordered are listed, but only abnormal results are displayed)  Labs Reviewed - No data to display ____________________________________________  RADIOLOGY  Image of the lumbar spine is negative for acute bony abnormality per radiology. ____________________________________________   PROCEDURES  Procedure(s) performed:  Procedures ____________________________________________   INITIAL IMPRESSION / ASSESSMENT AND PLAN / ED COURSE  Marcus Keller is a 37 y.o. male who presents to the emergency department for treatment and evaluation of low back pain. Pain is diffuse along the lumbar area, but is focally tender midline. X-ray has been ordered. Toradol and Valium to be administered.  While here, patient did receive significant relief after medications.  X-ray results discussed with the patient.  He is to follow-up with the orthopedist for symptoms that are not improving over the next few days.  He will be treated with Naprosyn and Valium.  He is to return to the emergency department for symptoms of change or worsen if unable to schedule an appointment.  Medications  ketorolac (TORADOL) 30 MG/ML injection 30 mg (30 mg Intramuscular Given 08/08/17 1051)  diazepam (VALIUM) tablet 5 mg (5 mg Oral Given 08/08/17 1050)    ED Discharge Orders        Ordered    naproxen (NAPROSYN) 500 MG tablet  2 times daily with meals     08/08/17 1211    diazepam (VALIUM) 2 MG tablet  Every 8 hours PRN     08/08/17 1211       Pertinent labs & imaging results that were available during my care of the patient were reviewed by me and considered in my medical decision making (see chart for  details).  _________________________________________   FINAL CLINICAL IMPRESSION(S) / ED DIAGNOSES  Final diagnoses:  Acute lumbar back pain     If controlled substance prescribed during this visit, 12 month history viewed on the [facility] prior to issuing an initial prescription for Schedule II or III opiod.    Chinita Pester, FNP 08/08/17 1538    Sharyn Creamer, MD 08/08/17 1540

## 2017-08-08 NOTE — ED Notes (Signed)
See triage note   Presents with lower back pain which started about 2 weeks ago   States pain is mainly on the left and radiates into buttocks min relief with moist heat and ibu.  Ambulates slowly d/t pain

## 2017-08-09 ENCOUNTER — Encounter: Payer: Self-pay | Admitting: Emergency Medicine

## 2017-08-09 ENCOUNTER — Emergency Department
Admission: EM | Admit: 2017-08-09 | Discharge: 2017-08-09 | Disposition: A | Discharge status: Home / Self Care | Payer: Self-pay | Attending: Emergency Medicine | Admitting: Emergency Medicine

## 2017-08-09 ENCOUNTER — Emergency Department: Payer: Self-pay

## 2017-08-09 DIAGNOSIS — Y929 Unspecified place or not applicable: Secondary | ICD-10-CM | POA: Insufficient documentation

## 2017-08-09 DIAGNOSIS — Z79899 Other long term (current) drug therapy: Secondary | ICD-10-CM | POA: Insufficient documentation

## 2017-08-09 DIAGNOSIS — W172XXA Fall into hole, initial encounter: Secondary | ICD-10-CM | POA: Insufficient documentation

## 2017-08-09 DIAGNOSIS — S92351A Displaced fracture of fifth metatarsal bone, right foot, initial encounter for closed fracture: Secondary | ICD-10-CM

## 2017-08-09 DIAGNOSIS — Y939 Activity, unspecified: Secondary | ICD-10-CM | POA: Insufficient documentation

## 2017-08-09 DIAGNOSIS — Y999 Unspecified external cause status: Secondary | ICD-10-CM | POA: Insufficient documentation

## 2017-08-09 DIAGNOSIS — S92354A Nondisplaced fracture of fifth metatarsal bone, right foot, initial encounter for closed fracture: Secondary | ICD-10-CM | POA: Insufficient documentation

## 2017-08-09 MED ORDER — OXYCODONE-ACETAMINOPHEN 7.5-325 MG PO TABS: 1.0000 | ORAL_TABLET | Freq: Four times a day (QID) | ORAL | 0 refills | Status: DC | PRN

## 2017-08-09 MED ORDER — IBUPROFEN 800 MG PO TABS
800.0000 mg | ORAL_TABLET | Freq: Once | ORAL | Status: AC
  Administered 2017-08-09: 800 mg via ORAL
  Filled 2017-08-09: qty 1

## 2017-08-09 MED ORDER — IBUPROFEN 800 MG PO TABS: 800.0000 mg | ORAL_TABLET | Freq: Three times a day (TID) | ORAL | 0 refills | Status: DC | PRN

## 2017-08-09 MED ORDER — OXYCODONE-ACETAMINOPHEN 5-325 MG PO TABS
1.0000 | ORAL_TABLET | Freq: Once | ORAL | Status: AC
  Administered 2017-08-09: 1 via ORAL
  Filled 2017-08-09: qty 1

## 2017-08-09 NOTE — ED Notes (Signed)
Pt states that he was doing some yard work and slipped on a slope in the yard and he heard a popping noise and since has been unable to bear weight on the outer aspect of his right foot

## 2017-08-09 NOTE — ED Triage Notes (Signed)
Patient presents to the ED with right foot pain.  Patient states at 3pm he was working and he fell in a hole.  Patient is in no obvious distress at this time.  Patient states he heard a "pop".

## 2017-08-09 NOTE — Discharge Instructions (Signed)
Keep foot Ace wrap and wear open shoe until evaluation by orthopedics.

## 2017-08-09 NOTE — ED Provider Notes (Signed)
[facility] Emergency Department Provider Note   ____________________________________________   First MD Initiated Contact with Patient 08/09/17 1709     (approximate)  I have reviewed the triage vital signs and the nursing notes.   HISTORY  Chief Complaint Foot Pain    HPI Lenell AntuJoseph Gattis Bottomley is a 37 y.o. male patient complain of right foot pain status post fall into a hole.  Incident occurred approximately 3 hours ago.  Patient state he heard a "pop" upon falling.  Patient state pain increased with weightbearing.  Patient denies loss of sensation or loss of function of the right foot.  Patient rates pain as a 10/10.  Patient described the pain as "sharp/achy".  No palliative measures prior to arrival.  Incident occurred at work.  History reviewed. No pertinent past medical history.  There are no active problems to display for this patient.   History reviewed. No pertinent surgical history.  Prior to Admission medications   Medication Sig Start Date End Date Taking? Authorizing Provider  baclofen (LIORESAL) 10 MG tablet Take 1 tablet (10 mg total) by mouth 3 (three) times daily. Patient not taking: Reported on 06/11/2016 07/05/15   Evangeline DakinBeers, Charles M, PA-C  diazepam (VALIUM) 2 MG tablet Take 1 tablet (2 mg total) by mouth every 8 (eight) hours as needed. 08/08/17   Triplett, Rulon Eisenmengerari B, FNP  ibuprofen (ADVIL,MOTRIN) 800 MG tablet Take 1 tablet (800 mg total) by mouth every 8 (eight) hours as needed. Patient not taking: Reported on 06/11/2016 07/05/15   Evangeline DakinBeers, Charles M, PA-C  ibuprofen (ADVIL,MOTRIN) 800 MG tablet Take 1 tablet (800 mg total) by mouth every 8 (eight) hours as needed for moderate pain. 08/09/17   Joni ReiningSmith, Svara Twyman K, PA-C  naproxen (NAPROSYN) 500 MG tablet Take 1 tablet (500 mg total) by mouth 2 (two) times daily with a meal. 08/08/17   Triplett, Cari B, FNP  oxyCODONE-acetaminophen (PERCOCET) 7.5-325 MG tablet Take 1 tablet by mouth every 6 (six) hours  as needed. 08/09/17   Joni ReiningSmith, Hazaiah Edgecombe K, PA-C  pantoprazole (PROTONIX) 40 MG tablet Take 1 tablet (40 mg total) by mouth daily. 06/11/16 06/11/17  Jene EveryKinner, Robert, MD    Allergies Flexeril [cyclobenzaprine] and Tramadol  No family history on file.  Social History Social History   Tobacco Use  . Smoking status: Never Smoker  . Smokeless tobacco: Never Used  Substance Use Topics  . Alcohol use: No  . Drug use: Not on file    Review of Systems Constitutional: No fever/chills Eyes: No visual changes. ENT: No sore throat. Cardiovascular: Denies chest pain. Respiratory: Denies shortness of breath. Gastrointestinal: No abdominal pain.  No nausea, no vomiting.  No diarrhea.  No constipation. Genitourinary: Negative for dysuria. Musculoskeletal: Right foot pain. Skin: Negative for rash. Neurological: Negative for headaches, focal weakness or numbness.: Allergic/Immunilogical: Flexeril and tramadol. ____________________________________________   PHYSICAL EXAM:  VITAL SIGNS: ED Triage Vitals  Enc Vitals Group     BP 08/09/17 1638 132/79     Pulse Rate 08/09/17 1638 88     Resp 08/09/17 1638 18     Temp 08/09/17 1638 98.3 F (36.8 C)     Temp Source 08/09/17 1638 Oral     SpO2 08/09/17 1638 95 %     Weight 08/09/17 1640 284 lb (128.8 kg)     Height 08/09/17 1640 6\' 1"  (1.854 m)     Head Circumference --      Peak Flow --      Pain  Score 08/09/17 1639 10     Pain Loc --      Pain Edu? --      Excl. in GC? --    Constitutional: Alert and oriented. Well appearing and in no acute distress. Neck: No stridor.  Cardiovascular: Normal rate, regular rhythm. Grossly normal heart sounds.  Good peripheral circulation. Respiratory: Normal respiratory effort.  No retractions. Lungs CTAB. Musculoskeletal: No obvious deformity to the right foot.  Patient has moderate guarding palpation of the proximal fifth metatarsal.. Neurologic:  Normal speech and language. No gross focal neurologic  deficits are appreciated. No gait instability. Skin:  Skin is warm, dry and intact. No rash noted. Psychiatric: Mood and affect are normal. Speech and behavior are normal.  ____________________________________________   LABS (all labs ordered are listed, but only abnormal results are displayed)  Labs Reviewed - No data to display ____________________________________________  EKG   ____________________________________________  RADIOLOGY  ED MD interpretation:    Official radiology report(s): Dg Foot Complete Right  Result Date: 08/09/2017 CLINICAL DATA:  Lateral pain in LEFT foot. EXAM: RIGHT FOOT COMPLETE - 3+ VIEW COMPARISON:  None. FINDINGS: Slightly displaced fracture within the proximal portion of the LEFT fifth metatarsal bone, compatible with Jones fracture. Remainder of the osseous structures are intact and normally aligned. Soft tissues about the LEFT foot are unremarkable. IMPRESSION: Slightly displaced Jones fracture within the proximal shaft of the LEFT fifth metatarsal bone. Electronically Signed   By: Bary Richard M.D.   On: 08/09/2017 17:26    ____________________________________________   PROCEDURES  Procedure(s) performed:   Procedures  Critical Care performed: No  ____________________________________________   INITIAL IMPRESSION / ASSESSMENT AND PLAN / ED COURSE  As part of my medical decision making, I reviewed the following data within the electronic MEDICAL RECORD NUMBER    Right foot pain secondary to left proximal fifth metatarsal tarsal fracture.  Discussed x-ray findings with patient.  Patient foot was Ace wrap placed in the open shoe.  Patient given discharge care instruction advised take medication as needed for pain.  Patient advised to call podiatry in the morning to schedule appointment for definitive evaluation and treatment.      ____________________________________________   FINAL CLINICAL IMPRESSION(S) / ED DIAGNOSES  Final  diagnoses:  Closed fracture of fifth metatarsal bone of right foot, initial encounter     ED Discharge Orders        Ordered    ibuprofen (ADVIL,MOTRIN) 800 MG tablet  Every 8 hours PRN     08/09/17 1751    oxyCODONE-acetaminophen (PERCOCET) 7.5-325 MG tablet  Every 6 hours PRN     08/09/17 1751       Note:  This document was prepared using Dragon voice recognition software and may include unintentional dictation errors.    Joni Reining, PA-C 08/09/17 1800    Emily Filbert, MD 08/09/17 2034

## 2019-04-27 ENCOUNTER — Ambulatory Visit: Payer: Self-pay

## 2019-05-16 ENCOUNTER — Encounter: Payer: Self-pay | Admitting: Emergency Medicine

## 2019-05-16 ENCOUNTER — Other Ambulatory Visit: Payer: Self-pay

## 2019-05-16 DIAGNOSIS — M1009 Idiopathic gout, multiple sites: Secondary | ICD-10-CM | POA: Insufficient documentation

## 2019-05-16 DIAGNOSIS — M79621 Pain in right upper arm: Secondary | ICD-10-CM | POA: Insufficient documentation

## 2019-05-16 DIAGNOSIS — Z79899 Other long term (current) drug therapy: Secondary | ICD-10-CM | POA: Insufficient documentation

## 2019-05-16 NOTE — ED Triage Notes (Signed)
Pt to ED from home c/o right arm pain x2 days, denies known injury.  States pain is radiating from right shoulder down to hand, very sensitive to touch.  Taking IBU at home without relief.  States noticed swelling to right hand.

## 2019-05-17 ENCOUNTER — Emergency Department: Payer: Self-pay

## 2019-05-17 ENCOUNTER — Emergency Department
Admission: EM | Admit: 2019-05-17 | Discharge: 2019-05-17 | Disposition: A | Discharge status: Home / Self Care | Payer: Self-pay | Attending: Emergency Medicine | Admitting: Emergency Medicine

## 2019-05-17 DIAGNOSIS — R52 Pain, unspecified: Secondary | ICD-10-CM

## 2019-05-17 DIAGNOSIS — M109 Gout, unspecified: Secondary | ICD-10-CM

## 2019-05-17 LAB — URIC ACID: Uric Acid, Serum: 7.4 mg/dL (ref 3.7–8.6)

## 2019-05-17 MED ORDER — KETOROLAC TROMETHAMINE 30 MG/ML IJ SOLN
30.0000 mg | Freq: Once | INTRAMUSCULAR | Status: AC
  Administered 2019-05-17: 30 mg via INTRAVENOUS
  Filled 2019-05-17: qty 1

## 2019-05-17 MED ORDER — OXYCODONE-ACETAMINOPHEN 5-325 MG PO TABS: 1.0000 | ORAL_TABLET | ORAL | 0 refills | Status: AC | PRN

## 2019-05-17 MED ORDER — INDOMETHACIN 50 MG PO CAPS: 50.0000 mg | ORAL_CAPSULE | Freq: Three times a day (TID) | ORAL | 0 refills | Status: AC | PRN

## 2019-05-17 NOTE — ED Notes (Signed)
Pt to xray

## 2019-05-17 NOTE — ED Notes (Signed)
Pt states 3 days ago he began to experience pain in the right elbow and shoulder that has progressively worsened. Pt is tender to touch in elbow and shoulder. Pain with rom in both

## 2019-05-17 NOTE — ED Provider Notes (Signed)
[facility] Emergency Department Provider Note  ____________________________________________   First MD Initiated Contact with Patient 05/17/19 [phone]     (approximate)  I have reviewed the triage vital signs and the nursing notes.   HISTORY  Chief Complaint Arm Pain   HPI Marcus Keller is a 39 y.o. male with no known chronic medical conditions presents to the emergency department secondary to cute onset of 10 out of 10 nontraumatic right elbow and shoulder pain.  Patient states that the pain is worse in his shoulder and radiates down to his elbow.  He states area is very sensitive to touch.  He does appreciate some swelling as well.  Patient has no personal history of gout however does admit to a strong family history of gout.  Patient denies any fever afebrile on presentation.  Patient denies any neck pain       History reviewed. No pertinent past medical history.  There are no problems to display for this patient.   History reviewed. No pertinent surgical history.  Prior to Admission medications   Medication Sig Start Date End Date Taking? Authorizing Provider  baclofen (LIORESAL) 10 MG tablet Take 1 tablet (10 mg total) by mouth 3 (three) times daily. Patient not taking: Reported on 06/11/2016 07/05/15   Arlyss Repress, PA-C  diazepam (VALIUM) 2 MG tablet Take 1 tablet (2 mg total) by mouth every 8 (eight) hours as needed. 08/08/17   Triplett, Johnette Abraham B, FNP  ibuprofen (ADVIL,MOTRIN) 800 MG tablet Take 1 tablet (800 mg total) by mouth every 8 (eight) hours as needed. Patient not taking: Reported on 06/11/2016 07/05/15   Arlyss Repress, PA-C  ibuprofen (ADVIL,MOTRIN) 800 MG tablet Take 1 tablet (800 mg total) by mouth every 8 (eight) hours as needed for moderate pain. 08/09/17   Sable Feil, PA-C  naproxen (NAPROSYN) 500 MG tablet Take 1 tablet (500 mg total) by mouth 2 (two) times daily with a meal. 08/08/17   Triplett, Cari B, FNP    oxyCODONE-acetaminophen (PERCOCET) 7.5-325 MG tablet Take 1 tablet by mouth every 6 (six) hours as needed. 08/09/17   Sable Feil, PA-C  pantoprazole (PROTONIX) 40 MG tablet Take 1 tablet (40 mg total) by mouth daily. 06/11/16 06/11/17  Lavonia Drafts, MD    Allergies Flexeril [cyclobenzaprine] and Tramadol  History reviewed. No pertinent family history.  Social History Social History   Tobacco Use  . Smoking status: Never Smoker  . Smokeless tobacco: Never Used  Substance Use Topics  . Alcohol use: No  . Drug use: Never    Review of Systems Constitutional: No fever/chills Eyes: No visual changes. ENT: No sore throat. Cardiovascular: Denies chest pain. Respiratory: Denies shortness of breath. Gastrointestinal: No abdominal pain.  No nausea, no vomiting.  No diarrhea.  No constipation. Genitourinary: Negative for dysuria. Musculoskeletal: Negative for neck pain.  Negative for back pain.  Positive for right shoulder and elbow pain Integumentary: Negative for rash. Neurological: Negative for headaches, focal weakness or numbness.   ____________________________________________   PHYSICAL EXAM:  VITAL SIGNS: ED Triage Vitals  Enc Vitals Group     BP 05/16/19 2354 (!) 142/81     Pulse Rate 05/16/19 2354 71     Resp 05/16/19 2354 16     Temp 05/16/19 2354 98.3 F (36.8 C)     Temp Source 05/16/19 2354 Oral     SpO2 05/16/19 2354 98 %     Weight 05/16/19 2355 136.1 kg (300 lb)  Height 05/16/19 2355 1.854 m (6\' 1" )     Head Circumference --      Peak Flow --      Pain Score 05/16/19 2355 10     Pain Loc --      Pain Edu? --      Excl. in GC? --     Constitutional: Alert and oriented.  Eyes: Conjunctivae are normal.  Mouth/Throat: Patient is wearing a mask. Neck: No stridor.  No meningeal signs.   Cardiovascular: Normal rate, regular rhythm. Good peripheral circulation. Grossly normal heart sounds. Respiratory: Normal respiratory effort.  No  retractions. Gastrointestinal: Soft and nontender. No distention.  Musculoskeletal: Right shoulder and right elbow warm to touch.  Painful with gentle palpation.  Pain with active and passive range of motion of both right shoulder and elbow.  Palpable radial pulses equal bilaterally.  Neurovascularly intact Neurologic:  Normal speech and language. No gross focal neurologic deficits are appreciated.  Skin:  Skin is warm, dry and intact. Psychiatric: Mood and affect are normal. Speech and behavior are normal.  ____________________________________________   RADIOLOGY I, [place] Gemini Bunte, personally viewed and evaluated these images (plain radiographs) as part of my medical decision making, as well as reviewing the written report by the radiologist.  ED MD interpretation: No acute or degenerative changes noted right elbow or shoulder  Official radiology report(s): DG Shoulder Right  Result Date: 05/17/2019 CLINICAL DATA:  Right elbow to shoulder pain for 3 days, atraumatic EXAM: RIGHT SHOULDER - 2+ VIEW COMPARISON:  None. FINDINGS: Os acromiale. No fracture, malalignment, or soft tissue calcification. No spurring or joint narrowing. IMPRESSION: No acute or degenerative finding. Os acromiale. Electronically Signed   By: 07/17/2019 M.D.   On: 05/17/2019 04:16   DG Elbow Complete Right  Result Date: 05/17/2019 CLINICAL DATA:  Right elbow 2 arm pain for 3 days, atraumatic. EXAM: RIGHT ELBOW - COMPLETE 3+ VIEW COMPARISON:  None. FINDINGS: There is no evidence of fracture, dislocation, or joint effusion. There is no evidence of arthropathy or other focal bone abnormality. Soft tissues are unremarkable. IMPRESSION: Negative. Electronically Signed   By: 07/17/2019 M.D.   On: 05/17/2019 04:17   07/17/2019 Venous Img Upper Uni Right(DVT)  Result Date: 05/17/2019 CLINICAL DATA:  Right arm pain for 2 days EXAM: RIGHT UPPER EXTREMITY VENOUS DOPPLER ULTRASOUND TECHNIQUE: Gray-scale sonography with graded  compression, as well as color Doppler and duplex ultrasound were performed to evaluate the upper extremity deep venous system from the level of the subclavian vein and including the jugular, axillary, basilic, radial, ulnar and upper cephalic vein. Spectral Doppler was utilized to evaluate flow at rest and with distal augmentation maneuvers. COMPARISON:  None. FINDINGS: Contralateral Subclavian Vein: Respiratory phasicity is normal and symmetric with the symptomatic side. No evidence of thrombus. Normal compressibility. Internal Jugular Vein: No evidence of thrombus. Normal compressibility, respiratory phasicity and response to augmentation. Subclavian Vein: No evidence of thrombus. Normal compressibility, respiratory phasicity and response to augmentation. Axillary Vein: No evidence of thrombus. Normal compressibility, respiratory phasicity and response to augmentation. Cephalic Vein: No evidence of thrombus. Normal compressibility, respiratory phasicity and response to augmentation. Basilic Vein: No evidence of thrombus. Normal compressibility, respiratory phasicity and response to augmentation. Brachial Veins: No evidence of thrombus. Normal compressibility, respiratory phasicity and response to augmentation. Radial Veins: No evidence of thrombus. Normal compressibility. Normal color flow. Ulnar Veins: No evidence of thrombus. Normal compressibility. Normal color flow. Venous Reflux:  None visualized. Other Findings:  None visualized.  IMPRESSION: No evidence of DVT within the right upper extremity. Electronically Signed   By: Kreg Shropshire M.D.   On: 05/17/2019 01:49    ____________________________________________   Procedures   ____________________________________________   INITIAL IMPRESSION / MDM / ASSESSMENT AND PLAN / ED COURSE  As part of my medical decision making, I reviewed the following data within the electronic MEDICAL RECORD NUMBER   39 year old male presented with above-stated history and  physical exam with differential diagnosis including but not limited to gout, tendinitis arthritis DVT.  X-rays revealed no acute findings ultrasound negative for DVT.Marland Kitchen  Patient given Toradol 30 mg IV and upon reevaluation patient states that pain is "much better".  Range of motion performed without any pain area was palpated without any pain.  Patient be prescribed indomethacin and Percocet for home with referral to orthopedics.  Suspect gout as etiology for the patient's symptoms. ____________________________________________  FINAL CLINICAL IMPRESSION(S) / ED DIAGNOSES  Final diagnoses:  Pain  Acute gout of multiple sites, unspecified cause     MEDICATIONS GIVEN DURING THIS VISIT:  Medications  ketorolac (TORADOL) 30 MG/ML injection 30 mg (30 mg Intravenous Given 05/17/19 3159)     ED Discharge Orders    None      *Please note:  Kiyoshi Schaab was evaluated in Emergency Department on 05/17/2019 for the symptoms described in the history of present illness. He was evaluated in the context of the global COVID-19 pandemic, which necessitated consideration that the patient might be at risk for infection with the SARS-CoV-2 virus that causes COVID-19. Institutional protocols and algorithms that pertain to the evaluation of patients at risk for COVID-19 are in a state of rapid change based on information released by regulatory bodies including the CDC and federal and state organizations. These policies and algorithms were followed during the patient's care in the ED.  Some ED evaluations and interventions may be delayed as a result of limited staffing during the pandemic.*  Note:  This document was prepared using Dragon voice recognition software and may include unintentional dictation errors.   Darci Current, MD 05/17/19 [phone]

## 2020-02-01 ENCOUNTER — Other Ambulatory Visit: Payer: Self-pay

## 2020-02-01 ENCOUNTER — Encounter: Payer: Self-pay | Admitting: Emergency Medicine

## 2020-02-01 ENCOUNTER — Emergency Department
Admission: EM | Admit: 2020-02-01 | Discharge: 2020-02-01 | Disposition: A | Discharge status: Home / Self Care | Payer: Medicaid Other | Attending: Emergency Medicine | Admitting: Emergency Medicine

## 2020-02-01 DIAGNOSIS — R109 Unspecified abdominal pain: Secondary | ICD-10-CM | POA: Insufficient documentation

## 2020-02-01 DIAGNOSIS — R197 Diarrhea, unspecified: Secondary | ICD-10-CM | POA: Insufficient documentation

## 2020-02-01 DIAGNOSIS — Z5321 Procedure and treatment not carried out due to patient leaving prior to being seen by health care provider: Secondary | ICD-10-CM | POA: Insufficient documentation

## 2020-02-01 DIAGNOSIS — R042 Hemoptysis: Secondary | ICD-10-CM | POA: Insufficient documentation

## 2020-02-01 LAB — COMPREHENSIVE METABOLIC PANEL
ALT: 23 U/L (ref 0–44)
AST: 22 U/L (ref 15–41)
Albumin: 4.1 g/dL (ref 3.5–5.0)
Alkaline Phosphatase: 41 U/L (ref 38–126)
Anion gap: 12 (ref 5–15)
BUN: 16 mg/dL (ref 6–20)
CO2: 26 mmol/L (ref 22–32)
Calcium: 9.2 mg/dL (ref 8.9–10.3)
Chloride: 100 mmol/L (ref 98–111)
Creatinine, Ser: 1.08 mg/dL (ref 0.61–1.24)
GFR, Estimated: >60 mL/min (ref >60)
Glucose, Bld: 99 mg/dL (ref 70–99)
Potassium: 3.7 mmol/L (ref 3.5–5.1)
Sodium: 138 mmol/L (ref 135–145)
Total Bilirubin: 0.5 mg/dL (ref 0.3–1.2)
Total Protein: 7.9 g/dL (ref 6.5–8.1)

## 2020-02-01 LAB — CBC
HCT: 41.2 % (ref 39.0–52.0)
Hemoglobin: 13.7 g/dL (ref 13.0–17.0)
MCH: 28.1 pg (ref 26.0–34.0)
MCHC: 33.3 g/dL (ref 30.0–36.0)
MCV: 84.4 fL (ref 80.0–100.0)
Platelets: 288 10*3/uL (ref 150–400)
RBC: 4.88 MIL/uL (ref 4.22–5.81)
RDW: 14.5 % (ref 11.5–15.5)
WBC: 6.2 10*3/uL (ref 4.0–10.5)
nRBC: 0 % (ref 0.0–0.2)

## 2020-02-01 LAB — LIPASE, BLOOD: Lipase: 27 U/L (ref 11–51)

## 2020-02-01 NOTE — ED Triage Notes (Signed)
Pt comes into the ED via EMS from home with c/o bloody emesis x2 this morning, c/o abd pain and diarrhea 134/90 56HR 97%RA [id number] #[id number] 4mg  IV zofran given

## 2021-06-22 IMAGING — CR DG ELBOW COMPLETE 3+V*R*
4 series · 4 of 4 positions shown · non-contrast
Comparison: None.

CLINICAL DATA: Right elbow 2 arm pain for 3 days, atraumatic.

EXAM:
RIGHT ELBOW - COMPLETE 3+ VIEW

[elbow ap]
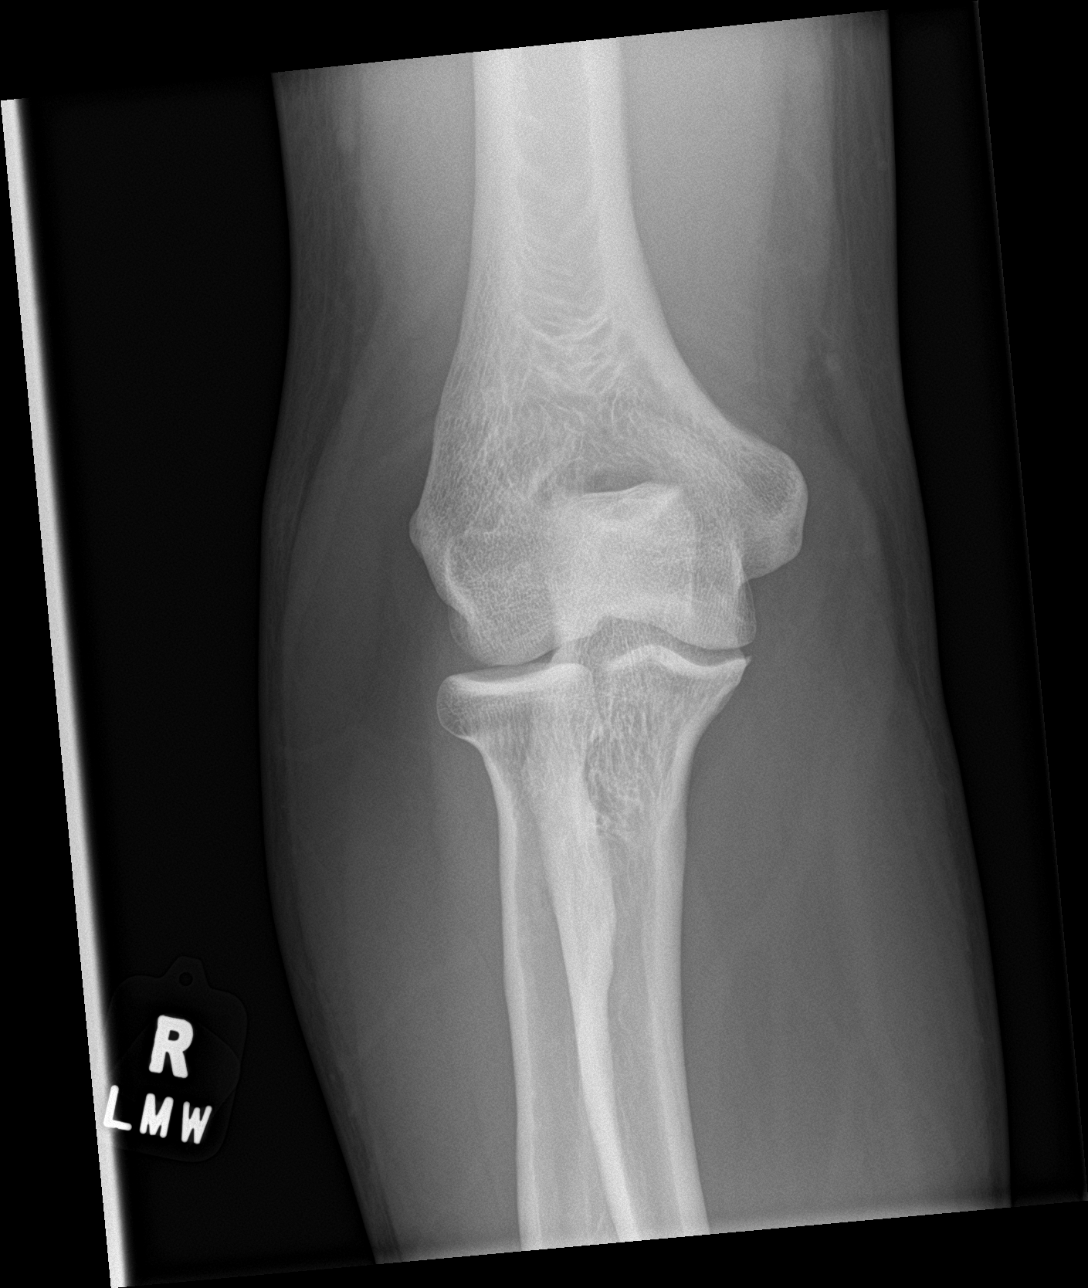

[elbow obl (1 of 2)]
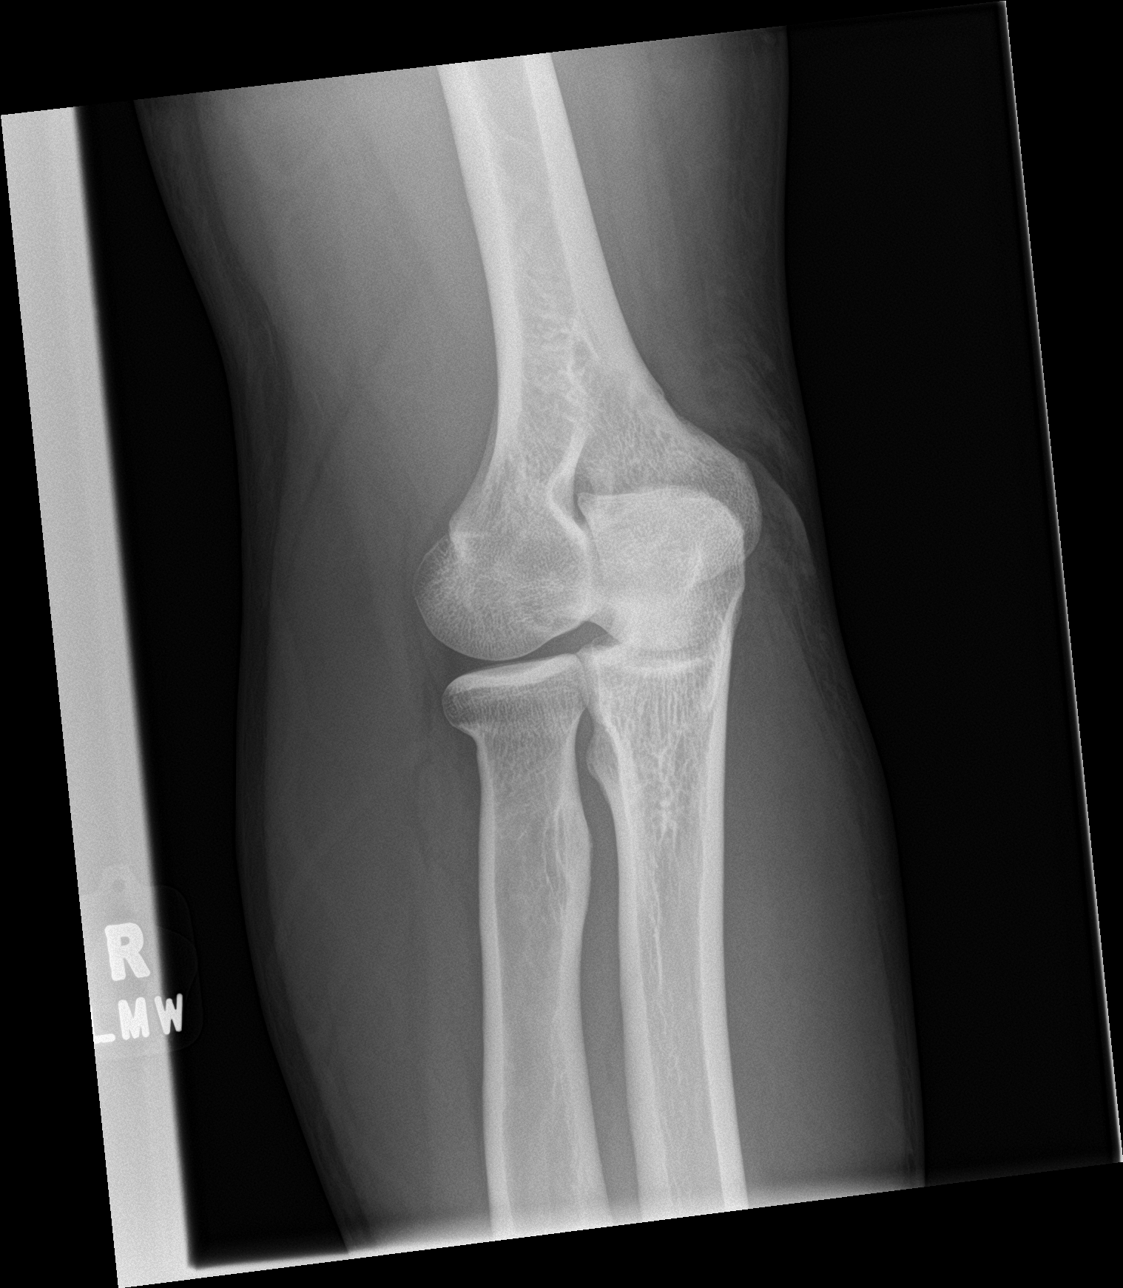

[elbow obl (2 of 2)]
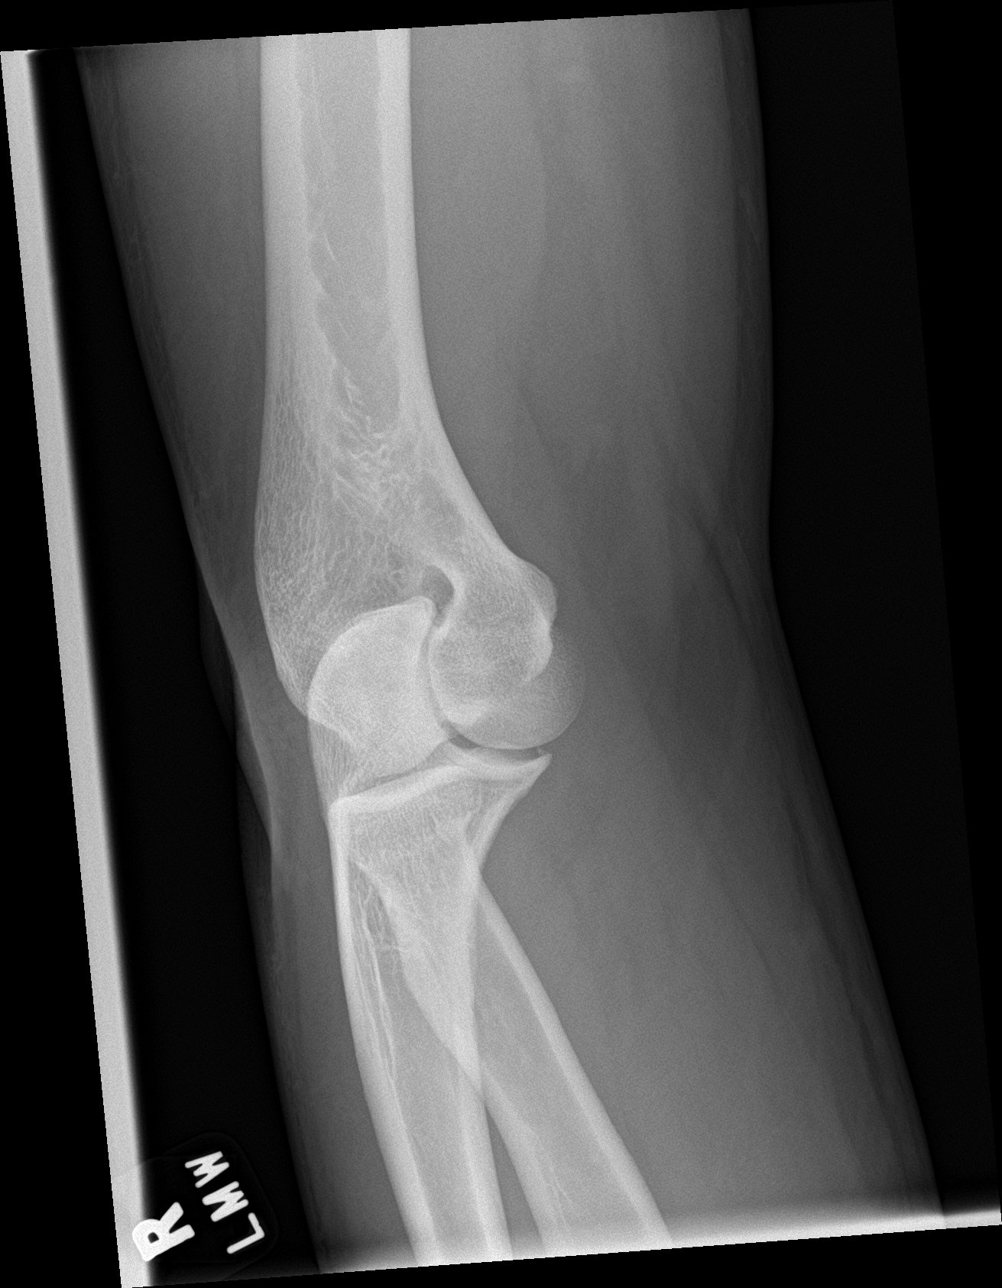

[elbow lat]
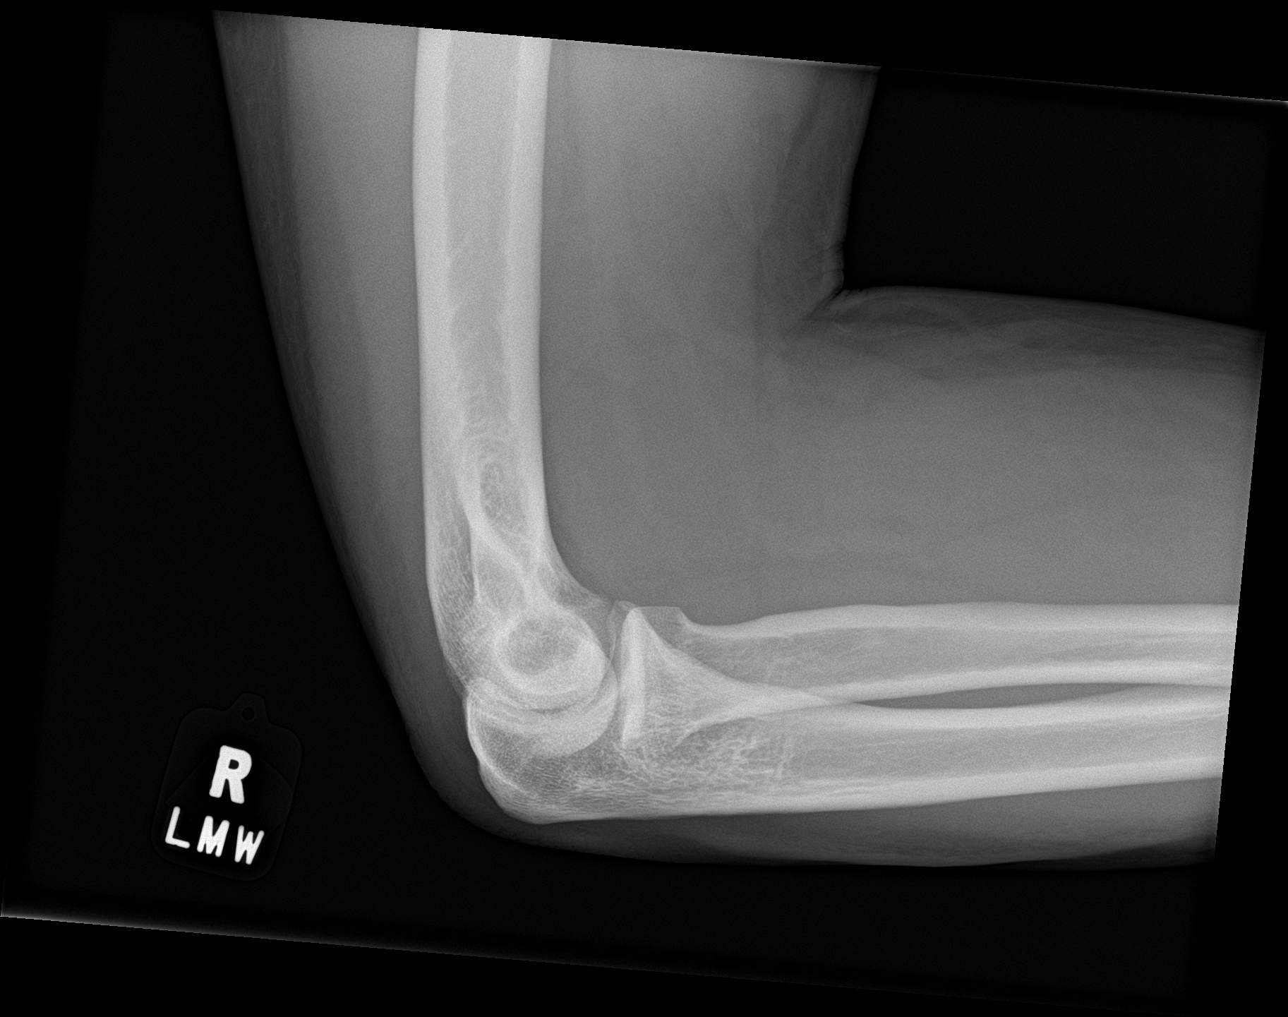

[4 of 4 positions shown; findings below may reference images not displayed]

FINDINGS: There is no evidence of fracture, dislocation, or joint effusion.
There is no evidence of arthropathy or other focal bone abnormality.
Soft tissues are unremarkable.
IMPRESSION: Negative.

## 2022-12-01 ENCOUNTER — Emergency Department
Admission: EM | Admit: 2022-12-01 | Discharge: 2022-12-01 | Disposition: A | Discharge status: Home / Self Care | Payer: Self-pay | Attending: Emergency Medicine | Admitting: Emergency Medicine

## 2022-12-01 ENCOUNTER — Other Ambulatory Visit: Payer: Self-pay

## 2022-12-01 ENCOUNTER — Encounter: Payer: Self-pay | Admitting: Emergency Medicine

## 2022-12-01 DIAGNOSIS — X501XXA Overexertion from prolonged static or awkward postures, initial encounter: Secondary | ICD-10-CM | POA: Diagnosis not present

## 2022-12-01 DIAGNOSIS — S86811A Strain of other muscle(s) and tendon(s) at lower leg level, right leg, initial encounter: Secondary | ICD-10-CM | POA: Diagnosis not present

## 2022-12-01 DIAGNOSIS — M79604 Pain in right leg: Secondary | ICD-10-CM | POA: Diagnosis present

## 2022-12-01 MED ORDER — OXYCODONE HCL 5 MG PO TABS: 5.0000 mg | ORAL_TABLET | Freq: Three times a day (TID) | ORAL | 0 refills | Status: AC | PRN

## 2022-12-01 MED ORDER — OXYCODONE-ACETAMINOPHEN 5-325 MG PO TABS
1.0000 | ORAL_TABLET | Freq: Once | ORAL | Status: AC
  Administered 2022-12-01: 1 via ORAL
  Filled 2022-12-01: qty 1

## 2022-12-01 MED ORDER — KETOROLAC TROMETHAMINE 60 MG/2ML IM SOLN
30.0000 mg | Freq: Once | INTRAMUSCULAR | Status: AC
  Administered 2022-12-01: 30 mg via INTRAMUSCULAR
  Filled 2022-12-01: qty 2

## 2022-12-01 NOTE — ED Provider Notes (Signed)
[facility] Provider Note    Event Date/Time   First MD Initiated Contact with Patient 12/01/22 1115     (approximate)   History   Leg Pain   HPI Marcus Keller is a 42 y.o. male presenting today for right leg pain.  Patient states he was out working earlier this morning and as she bent over, he felt a sharp pain in his right calf.  Was then unable to walk on it due to the pain.  Denied any direct trauma anywhere.  Denied any swelling or redness.  No prior history of issues with that leg.     Physical Exam   Triage Vital Signs: ED Triage Vitals  Encounter Vitals Group     BP 12/01/22 1059 136/83     Systolic BP Percentile --      Diastolic BP Percentile --      Pulse Rate 12/01/22 1059 (!) 58     Resp 12/01/22 1059 16     Temp 12/01/22 1059 97.9 F (36.6 C)     Temp Source 12/01/22 1059 Oral     SpO2 12/01/22 1059 97 %     Weight 12/01/22 1100 240 lb 1.3 oz (108.9 kg)     Height 12/01/22 1100 6\' 2"  (1.88 m)     Head Circumference --      Peak Flow --      Pain Score 12/01/22 1100 10     Pain Loc --      Pain Education --      Exclude from Growth Chart --     Most recent vital signs: Vitals:   12/01/22 1059  BP: 136/83  Pulse: (!) 58  Resp: 16  Temp: 97.9 F (36.6 C)  SpO2: 97%   I have reviewed the vital signs. General:  Awake, alert, no acute distress. Head:  Normocephalic, Atraumatic. EENT:  PERRL, EOMI, Oral mucosa pink and moist, Neck is supple. Cardiovascular: Regular rate, 2+ distal pulses. Respiratory:  Normal respiratory effort, symmetrical expansion, no distress.   Extremities: Tenderness palpation throughout right calf and down right Achilles tendon.  No obvious deformities.  No tenderness to bony prominences of the right lower extremity.  Equivocal Thompson test. Neuro:  Alert and oriented.  Interacting appropriately.   Skin:  Warm, dry, no rash.   Psych: Appropriate affect.    ED Results / Procedures /  Treatments   Labs (all labs ordered are listed, but only abnormal results are displayed) Labs Reviewed - No data to display   EKG    RADIOLOGY    PROCEDURES:  Critical Care performed: No  Procedures   MEDICATIONS ORDERED IN ED: Medications  ketorolac (TORADOL) injection 30 mg (30 mg Intramuscular Given 12/01/22 1139)  oxyCODONE-acetaminophen (PERCOCET/ROXICET) 5-325 MG per tablet 1 tablet (1 tablet Oral Given 12/01/22 1139)     IMPRESSION / MDM / ASSESSMENT AND PLAN / ED COURSE  I reviewed the triage vital signs and the nursing notes.                              Differential diagnosis includes, but is not limited to, calf tear, calf strain, Achilles tendon rupture  Patient's presentation is most consistent with acute complicated illness / injury requiring diagnostic workup.  Patient is a 42 year old male presenting today for sharp onset of pain in his right calf and feeling a pop.  Tenderness palpation over the calf.  Equivocal  Thompson test.  Bedside ultrasound largely shows intact Achilles tendon.  Do note swelling within the right calf.  Largest concern today is calf strain versus calf tear.  Given significant pain symptoms, will place in cam boot and give crutches for ambulation.  Will have follow-up with orthopedics within the week for reassessment.  Pain medication sent to pharmacy and given strict return precautions.     FINAL CLINICAL IMPRESSION(S) / ED DIAGNOSES   Final diagnoses:  Strain of calf muscle, right, initial encounter     Rx / DC Orders   ED Discharge Orders          Ordered    AMB referral to orthopedics       Comments: Severe calf strain versus possible tear   12/01/22 1254    oxyCODONE (ROXICODONE) 5 MG immediate release tablet  Every 8 hours PRN        12/01/22 1254             Note:  This document was prepared using Dragon voice recognition software and may include unintentional dictation errors.   Janith Lima,  MD 12/01/22 1254

## 2022-12-01 NOTE — ED Notes (Signed)
See triage note  Presents with pain to right lower leg States he was bending over a rail  Felt a pain to posterior lower leg/knee   then he fell

## 2022-12-01 NOTE — ED Triage Notes (Signed)
Pt to ED via POV for right leg pain. Pt states that he was at work and bent down to pick something up and felt a pop in his right calf. Pt states that he is having 10/10 pain in his calf. Pt reports pain is a burning, achy pain and that his leg is sore to the touch. Pt is in NAD.

## 2022-12-01 NOTE — Discharge Instructions (Addendum)
You can take ibuprofen 600 mg every 8 hours for the next 5 days and then take as needed.  I have sent stronger medication to your pharmacy to take only as needed for more severe pain.  Please do not take this medication while operating a vehicle.
